# Patient Record
Sex: Male | Born: 1990 | Race: White | Hispanic: No | Marital: Single | Smoking: Current every day smoker
Health system: Southern US, Community
[De-identification: ages and names within clinical notes are randomized; demographics above are authoritative.]

## PROBLEM LIST (undated history)

## (undated) HISTORY — PX: TONSILLECTOMY: SUR1361

---

## 2015-06-23 ENCOUNTER — Emergency Department (HOSPITAL_BASED_OUTPATIENT_CLINIC_OR_DEPARTMENT_OTHER): Payer: BLUE CROSS/BLUE SHIELD

## 2015-06-23 ENCOUNTER — Encounter (HOSPITAL_BASED_OUTPATIENT_CLINIC_OR_DEPARTMENT_OTHER): Payer: Self-pay | Admitting: *Deleted

## 2015-06-23 ENCOUNTER — Emergency Department (HOSPITAL_BASED_OUTPATIENT_CLINIC_OR_DEPARTMENT_OTHER)
Admission: EM | Admit: 2015-06-23 | Discharge: 2015-06-23 | Disposition: A | Discharge status: Home / Self Care | Payer: BLUE CROSS/BLUE SHIELD | Attending: Emergency Medicine | Admitting: Emergency Medicine

## 2015-06-23 DIAGNOSIS — R51 Headache: Secondary | ICD-10-CM | POA: Insufficient documentation

## 2015-06-23 DIAGNOSIS — R519 Headache, unspecified: Secondary | ICD-10-CM

## 2015-06-23 DIAGNOSIS — F17210 Nicotine dependence, cigarettes, uncomplicated: Secondary | ICD-10-CM | POA: Diagnosis not present

## 2015-06-23 NOTE — Discharge Instructions (Signed)
Ibuprofen 600 g every 6 hours as needed for pain.  Watch your caffeine intake.  Return to the emergency department if symptoms significantly worsen or change.   General Headache Without Cause A headache is pain or discomfort felt around the head or neck area. The specific cause of a headache may not be found. There are many causes and types of headaches. A few common ones are:  Tension headaches.  Migraine headaches.  Cluster headaches.  Chronic daily headaches. HOME CARE INSTRUCTIONS  Watch your condition for any changes. Take these steps to help with your condition: Managing Pain  Take over-the-counter and prescription medicines only as told by your health care provider.  Lie down in a dark, quiet room when you have a headache.  If directed, apply ice to the head and neck area:  Put ice in a plastic bag.  Place a towel between your skin and the bag.  Leave the ice on for 20 minutes, 2-3 times per day.  Use a heating pad or hot shower to apply heat to the head and neck area as told by your health care provider.  Keep lights dim if bright lights bother you or make your headaches worse. Eating and Drinking  Eat meals on a regular schedule.  Limit alcohol use.  Decrease the amount of caffeine you drink, or stop drinking caffeine. General Instructions  Keep all follow-up visits as told by your health care provider. This is important.  Keep a headache journal to help find out what may trigger your headaches. For example, write down:  What you eat and drink.  How much sleep you get.  Any change to your diet or medicines.  Try massage or other relaxation techniques.  Limit stress.  Sit up straight, and do not tense your muscles.  Do not use tobacco products, including cigarettes, chewing tobacco, or e-cigarettes. If you need help quitting, ask your health care provider.  Exercise regularly as told by your health care provider.  Sleep on a regular schedule.  Get 7-9 hours of sleep, or the amount recommended by your health care provider. SEEK MEDICAL CARE IF:   Your symptoms are not helped by medicine.  You have a headache that is different from the usual headache.  You have nausea or you vomit.  You have a fever. SEEK IMMEDIATE MEDICAL CARE IF:   Your headache becomes severe.  You have repeated vomiting.  You have a stiff neck.  You have a loss of vision.  You have problems with speech.  You have pain in the eye or ear.  You have muscular weakness or loss of muscle control.  You lose your balance or have trouble walking.  You feel faint or pass out.  You have confusion.   This information is not intended to replace advice given to you by your health care provider. Make sure you discuss any questions you have with your health care provider.   Document Released: 05/09/2005 Document Revised: 01/28/2015 Document Reviewed: 09/01/2014 Elsevier Interactive Patient Education [employer]

## 2015-06-23 NOTE — ED Notes (Signed)
Patient transported to CT 

## 2015-06-23 NOTE — ED Notes (Signed)
Headache since 4 am.

## 2015-06-23 NOTE — ED Provider Notes (Signed)
CSN: [id number]     Arrival date & time 06/23/15  1727 History  By signing my name below, I, Doreatha Martin, attest that this documentation has been prepared under the direction and in the presence of Geoffery Lyons, MD. Electronically Signed: Doreatha Martin, ED Scribe. 06/23/2015. 6:52 PM.    Chief Complaint  Patient presents with  . Headache   Patient is a 25 y.o. male presenting with headaches. The history is provided by the patient. No language interpreter was used.  Headache Pain location:  Generalized Quality:  Dull Radiates to:  Does not radiate Onset quality:  Gradual Duration:  1 day Timing:  Constant Progression:  Partially resolved Chronicity:  New Similar to prior headaches: yes   Context: caffeine   Relieved by:  Acetaminophen Worsened by:  Nothing Ineffective treatments:  None tried Associated symptoms: no blurred vision, no dizziness, no nausea, no neck pain, no numbness, no paresthesias, no tingling, no vomiting and no weakness     HPI Comments: Tristan Sanchez is a 25 y.o. male otherwise healthy who presents to the Emergency Department complaining of moderate, improving central HA onset this morning. He states that he took ibuprofen with moderate relief after 45 minutes, but notes that he still has some mild throbbing pain and soreness. Pt states h/o similar HA, but not as severe. He states he drinks 1 redbull per day and is an occasional drinker. Pt notes he has been drinking more for the past few days than normal. He denies emesis, nausea, visual disturbance, numbness, paresthesia, weakness, neck pain.   History reviewed. No pertinent past medical history. Past Surgical History  Procedure Laterality Date  . Tonsillectomy     No family history on file. Social History  Substance Use Topics  . Smoking status: Current Every Day Smoker -- 0.50 packs/day    Types: Cigarettes  . Smokeless tobacco: None  . Alcohol Use: No    Review of Systems  Eyes: Negative for  blurred vision.  Gastrointestinal: Negative for nausea and vomiting.  Musculoskeletal: Negative for neck pain.  Neurological: Positive for headaches. Negative for dizziness, weakness, numbness and paresthesias.  All other systems reviewed and are negative.  Allergies  Review of patient's allergies indicates no known allergies.  Home Medications   Prior to Admission medications   Not on File   BP 147/82 mmHg  Pulse 60  Temp(Src) 98.3 F (36.8 C) (Oral)  Resp 18  Ht  (1.956 m)  Wt 210 lb (95.255 kg)  BMI 24.90 kg/m2  SpO2 100% Physical Exam  Constitutional: He is oriented to person, place, and time. He appears well-developed and well-nourished.  HENT:  Head: Normocephalic and atraumatic.  Eyes: EOM are normal. Pupils are equal, round, and reactive to light.  No papilledema on fundoscopic exam.   Neck: Normal range of motion.  Cardiovascular: Normal rate, regular rhythm, normal heart sounds and intact distal pulses.   Pulmonary/Chest: Effort normal and breath sounds normal. No respiratory distress.  Abdominal: Soft. He exhibits no distension. There is no tenderness.  Musculoskeletal: Normal range of motion.  Neurological: He is alert and oriented to person, place, and time. He has normal reflexes. No cranial nerve deficit. He exhibits normal muscle tone. Coordination normal.  Skin: Skin is warm and dry.  Psychiatric: He has a normal mood and affect. Judgment normal.  Nursing note and vitals reviewed.   ED Course  Procedures (including critical care time) DIAGNOSTIC STUDIES: Oxygen Saturation is 100% on RA, normal by my interpretation.  COORDINATION OF CARE: 6:50 PM Discussed treatment plan with pt at bedside and pt agreed to plan.    Imaging Review No results found. I have personally reviewed and evaluated these images as part of my medical decision-making.   MDM   Final diagnoses:  None    Patient presents here with complaints of severe headache that  started shortly after he woke up this morning. It has improved throughout the day, however he was concerned with the severity that he experienced this morning. His neurologic exam is nonfocal and head CT is negative. He is feeling better. I see no indication for LP. He will be discharged with instructions take ibuprofen and follow-up as needed for any problems.  I personally performed the services described in this documentation, which was scribed in my presence. The recorded information has been reviewed and is accurate.       Geoffery Lyons, MD 06/23/15 [phone]

## 2015-06-23 NOTE — ED Notes (Signed)
Pt reports he awoke this am 0300 hours with severe headache, throughout day it continued 10/10, mostly to left side of head, currently pain has improved after taking goody powder this afternoon. Pt ambulated to room in nad, talking and communicating with adult at bedside accompanying him

## 2015-07-23 ENCOUNTER — Emergency Department (HOSPITAL_BASED_OUTPATIENT_CLINIC_OR_DEPARTMENT_OTHER)
Admission: EM | Admit: 2015-07-23 | Discharge: 2015-07-23 | Disposition: A | Discharge status: Home / Self Care | Payer: BLUE CROSS/BLUE SHIELD | Attending: Emergency Medicine | Admitting: Emergency Medicine

## 2015-07-23 ENCOUNTER — Emergency Department (HOSPITAL_BASED_OUTPATIENT_CLINIC_OR_DEPARTMENT_OTHER): Payer: BLUE CROSS/BLUE SHIELD

## 2015-07-23 ENCOUNTER — Encounter (HOSPITAL_BASED_OUTPATIENT_CLINIC_OR_DEPARTMENT_OTHER): Payer: Self-pay | Admitting: Emergency Medicine

## 2015-07-23 DIAGNOSIS — S6992XA Unspecified injury of left wrist, hand and finger(s), initial encounter: Secondary | ICD-10-CM | POA: Insufficient documentation

## 2015-07-23 DIAGNOSIS — S060X1A Concussion with loss of consciousness of 30 minutes or less, initial encounter: Secondary | ICD-10-CM | POA: Diagnosis not present

## 2015-07-23 DIAGNOSIS — Y990 Civilian activity done for income or pay: Secondary | ICD-10-CM | POA: Diagnosis not present

## 2015-07-23 DIAGNOSIS — M25532 Pain in left wrist: Secondary | ICD-10-CM

## 2015-07-23 DIAGNOSIS — S0990XA Unspecified injury of head, initial encounter: Secondary | ICD-10-CM | POA: Diagnosis present

## 2015-07-23 DIAGNOSIS — S0191XA Laceration without foreign body of unspecified part of head, initial encounter: Secondary | ICD-10-CM

## 2015-07-23 DIAGNOSIS — Y9289 Other specified places as the place of occurrence of the external cause: Secondary | ICD-10-CM | POA: Diagnosis not present

## 2015-07-23 DIAGNOSIS — S0181XA Laceration without foreign body of other part of head, initial encounter: Secondary | ICD-10-CM | POA: Diagnosis not present

## 2015-07-23 DIAGNOSIS — W208XXA Other cause of strike by thrown, projected or falling object, initial encounter: Secondary | ICD-10-CM | POA: Insufficient documentation

## 2015-07-23 DIAGNOSIS — Y9389 Activity, other specified: Secondary | ICD-10-CM | POA: Diagnosis not present

## 2015-07-23 DIAGNOSIS — F17210 Nicotine dependence, cigarettes, uncomplicated: Secondary | ICD-10-CM | POA: Diagnosis not present

## 2015-07-23 NOTE — ED Provider Notes (Signed)
CSN: [id number]     Arrival date & time 07/23/15  1412 History   First MD Initiated Contact with Patient 07/23/15 1559     Chief Complaint  Patient presents with  . Head Injury     (Consider location/radiation/quality/duration/timing/severity/associated sxs/prior Treatment) HPI Comments: Patient presents to the ED with a chief complaint of head injury.  He states that a hood fell on his head last night.  He states that he had LOC briefly afterward.  States that he also had some nausea and vomiting.  States that he is concerned about being "comatose."  He states that he has had some persistent headache, and nausea.  Denies any additional vomiting, blurred vision, or numbness/weakness/tingling/ataxia.  He also complains of left wrist injury.  States that the hood struck his left wrist and he has had significant pain and swelling.  The history is provided by the patient. No language interpreter was used.    No past medical history on file. Past Surgical History  Procedure Laterality Date  . Tonsillectomy     No family history on file. Social History  Substance Use Topics  . Smoking status: Current Every Day Smoker -- 0.50 packs/day    Types: Cigarettes  . Smokeless tobacco: None  . Alcohol Use: No    Review of Systems  All other systems reviewed and are negative.     Allergies  Review of patient's allergies indicates no known allergies.  Home Medications   Prior to Admission medications   Not on File   BP 135/76 mmHg  Pulse 54  Temp(Src) 97.8 F (36.6 C) (Oral)  Resp 18  Ht  (1.956 m)  Wt 95.255 kg  BMI 24.90 kg/m2  SpO2 100% Physical Exam  Constitutional: He is oriented to person, place, and time. He appears well-developed and well-nourished.  HENT:  Head: Normocephalic and atraumatic.  2 cm laceration to the left parietal scalp which was repaired by the patient using superglue, no evidence of abscess or infection  Eyes: Conjunctivae and EOM are normal.  Pupils are equal, round, and reactive to light. Right eye exhibits no discharge. Left eye exhibits no discharge. No scleral icterus.  Neck: Normal range of motion. Neck supple. No JVD present.  Cardiovascular: Normal rate, regular rhythm and normal heart sounds.  Exam reveals no gallop and no friction rub.   No murmur heard. Pulmonary/Chest: Effort normal and breath sounds normal. No respiratory distress. He has no wheezes. He has no rales. He exhibits no tenderness.  Abdominal: Soft. He exhibits no distension and no mass. There is no tenderness. There is no rebound and no guarding.  Musculoskeletal: Normal range of motion. He exhibits no edema or tenderness.  No CTLS spine tenderness, step-off, or deformity Moves all extremities Left wrist ttp, mild swelling along the ulnar aspect No bony abnormality or deformity  Neurological: He is alert and oriented to person, place, and time.  Skin: Skin is warm and dry.  Psychiatric: He has a normal mood and affect. His behavior is normal. Judgment and thought content normal.  Nursing note and vitals reviewed.   ED Course  Procedures (including critical care time)  No results found for this or any previous visit. Dg Wrist Complete Left  07/23/2015  CLINICAL DATA:  Pain after lifting car hood EXAM: LEFT WRIST - COMPLETE 3+ VIEW COMPARISON:  None. FINDINGS: Frontal, oblique, lateral, and ulnar deviation scaphoid images were obtained. There is no apparent fracture or dislocation. The joint spaces appear normal. The pronator  quadratus fat pad does not show elevation. IMPRESSION: No demonstrable fracture or dislocation.  No apparent arthropathy. Electronically Signed   By: Bretta Bang III M.D.   On: 07/23/2015 16:34   Ct Head Wo Contrast  07/23/2015  CLINICAL DATA:  Car hood fell on head yesterday. Positive loss of consciousness. Vomiting. EXAM: CT HEAD WITHOUT CONTRAST TECHNIQUE: Contiguous axial images were obtained from the base of the skull through  the vertex without intravenous contrast. COMPARISON:  CT scan of June 23, 2015. FINDINGS: Bony calvarium appears intact. No mass effect or midline shift is noted. Ventricular size is within normal limits. There is no evidence of mass lesion, hemorrhage or acute infarction. IMPRESSION: Normal head CT. Electronically Signed   By: Lupita Raider, M.D.   On: 07/23/2015 16:32   Ct Head Wo Contrast  06/23/2015  CLINICAL DATA:  Headaches since this morning. EXAM: CT HEAD WITHOUT CONTRAST TECHNIQUE: Contiguous axial images were obtained from the base of the skull through the vertex without contrast. COMPARISON:  None FINDINGS: Normal appearance of the intracranial structures. No evidence for acute hemorrhage, mass lesion, midline shift, hydrocephalus or large infarct. No acute bony abnormality. The visualized sinuses are clear. IMPRESSION: Negative head CT. Electronically Signed   By: Richarda Overlie M.D.   On: 06/23/2015 19:34     I have personally reviewed and evaluated these images and lab results as part of my medical decision-making.    MDM   Final diagnoses:  Head injury, initial encounter  Concussion, with loss of consciousness of 30 minutes or less, initial encounter  Laceration of head, initial encounter  Left wrist pain   Patient with head injury.  He is quite concerned about this because of LOC and vomiting after the fact.  He has been fine today though.  Given patient reassurance, but will check head CT and will also image left wrist.  Tdap is up to date.  Patient closed his own head laceration yesterday with superglue.  Wound care precautions given.    Roxy Horseman, PA-C 07/23/15 1730  Vanetta Mulders, MD 07/24/15 [phone]

## 2015-07-23 NOTE — ED Notes (Signed)
Pt working on a car yesterday, hood of car fell onto pts head.  Pt had brief loss of consciousness 5 minutes later.   Pt had diaphoresis, vomited x 1 at that time.  Pt now is having some pressure behind his left eye.  Some left sided tooth pain, and some lower back pain.  Pt also has injury to left wrist when pt tried to catch the hood of car.  Bruising noted.  No new emesis.  No blurred vision.  No ambulation problems.

## 2015-07-23 NOTE — ED Notes (Signed)
Left wrist has slight discoloration and swelling.

## 2015-07-23 NOTE — Discharge Instructions (Signed)
Concussion, Adult °A concussion, or closed-head injury, is a brain injury caused by a direct blow to the head or by a quick and sudden movement (jolt) of the head or neck. Concussions are usually not life-threatening. Even so, the effects of a concussion can be serious. If you have had a concussion before, you are more likely to experience concussion-like symptoms after a direct blow to the head.  °CAUSES °· Direct blow to the head, such as from running into another player during a soccer game, being hit in a fight, or hitting your head on a hard surface. °· A jolt of the head or neck that causes the brain to move back and forth inside the skull, such as in a car crash. °SIGNS AND SYMPTOMS °The signs of a concussion can be hard to notice. Early on, they may be missed by you, family members, and health care providers. You may look fine but act or feel differently. °Symptoms are usually temporary, but they may last for days, weeks, or even longer. Some symptoms may appear right away while others may not show up for hours or days. Every head injury is different. Symptoms include: °· Mild to moderate headaches that will not go away. °· A feeling of pressure inside your head. °· Having more trouble than usual: °¨ Learning or remembering things you have heard. °¨ Answering questions. °¨ Paying attention or concentrating. °¨ Organizing daily tasks. °¨ Making decisions and solving problems. °· Slowness in thinking, acting or reacting, speaking, or reading. °· Getting lost or being easily confused. °· Feeling tired all the time or lacking energy (fatigued). °· Feeling drowsy. °· Sleep disturbances. °¨ Sleeping more than usual. °¨ Sleeping less than usual. °¨ Trouble falling asleep. °¨ Trouble sleeping (insomnia). °· Loss of balance or feeling lightheaded or dizzy. °· Nausea or vomiting. °· Numbness or tingling. °· Increased sensitivity to: °¨ Sounds. °¨ Lights. °¨ Distractions. °· Vision problems or eyes that tire  easily. °· Diminished sense of taste or smell. °· Ringing in the ears. °· Mood changes such as feeling sad or anxious. °· Becoming easily irritated or angry for little or no reason. °· Lack of motivation. °· Seeing or hearing things other people do not see or hear (hallucinations). °DIAGNOSIS °Your health care provider can usually diagnose a concussion based on a description of your injury and symptoms. He or she will ask whether you passed out (lost consciousness) and whether you are having trouble remembering events that happened right before and during your injury. °Your evaluation might include: °· A brain scan to look for signs of injury to the brain. Even if the test shows no injury, you may still have a concussion. °· Blood tests to be sure other problems are not present. °TREATMENT °· Concussions are usually treated in an emergency department, in urgent care, or at a clinic. You may need to stay in the hospital overnight for further treatment. °· Tell your health care provider if you are taking any medicines, including prescription medicines, over-the-counter medicines, and natural remedies. Some medicines, such as blood thinners (anticoagulants) and aspirin, may increase the chance of complications. Also tell your health care provider whether you have had alcohol or are taking illegal drugs. This information may affect treatment. °· Your health care provider will send you home with important instructions to follow. °· How fast you will recover from a concussion depends on many factors. These factors include how severe your concussion is, what part of your brain was injured,   your age, and how healthy you were before the concussion. °· Most people with mild injuries recover fully. Recovery can take time. In general, recovery is slower in older persons. Also, persons who have had a concussion in the past or have other medical problems may find that it takes longer to recover from their current injury. °HOME  CARE INSTRUCTIONS °General Instructions °· Carefully follow the directions your health care provider gave you. °· Only take over-the-counter or prescription medicines for pain, discomfort, or fever as directed by your health care provider. °· Take only those medicines that your health care provider has approved. °· Do not drink alcohol until your health care provider says you are well enough to do so. Alcohol and certain other drugs may slow your recovery and can put you at risk of further injury. °· If it is harder than usual to remember things, write them down. °· If you are easily distracted, try to do one thing at a time. For example, do not try to watch TV while fixing dinner. °· Talk with family members or close friends when making important decisions. °· Keep all follow-up appointments. Repeated evaluation of your symptoms is recommended for your recovery. °· Watch your symptoms and tell others to do the same. Complications sometimes occur after a concussion. Older adults with a brain injury may have a higher risk of serious complications, such as a blood clot on the brain. °· Tell your teachers, school nurse, school counselor, coach, athletic trainer, or work manager about your injury, symptoms, and restrictions. Tell them about what you can or cannot do. They should watch for: °¨ Increased problems with attention or concentration. °¨ Increased difficulty remembering or learning new information. °¨ Increased time needed to complete tasks or assignments. °¨ Increased irritability or decreased ability to cope with stress. °¨ Increased symptoms. °· Rest. Rest helps the brain to heal. Make sure you: °¨ Get plenty of sleep at night. Avoid staying up late at night. °¨ Keep the same bedtime hours on weekends and weekdays. °¨ Rest during the day. Take daytime naps or rest breaks when you feel tired. °· Limit activities that require a lot of thought or concentration. These include: °¨ Doing homework or job-related  work. °¨ Watching TV. °¨ Working on the computer. °· Avoid any situation where there is potential for another head injury (football, hockey, soccer, basketball, martial arts, downhill snow sports and horseback riding). Your condition will get worse every time you experience a concussion. You should avoid these activities until you are evaluated by the appropriate follow-up health care providers. °Returning To Your Regular Activities °You will need to return to your normal activities slowly, not all at once. You must give your body and brain enough time for recovery. °· Do not return to sports or other athletic activities until your health care provider tells you it is safe to do so. °· Ask your health care provider when you can drive, ride a bicycle, or operate heavy machinery. Your ability to react may be slower after a brain injury. Never do these activities if you are dizzy. °· Ask your health care provider about when you can return to work or school. °Preventing Another Concussion °It is very important to avoid another brain injury, especially before you have recovered. In rare cases, another injury can lead to permanent brain damage, brain swelling, or death. The risk of this is greatest during the first 7-10 days after a head injury. Avoid injuries by: °· Wearing a   seat belt when riding in a car.  Drinking alcohol only in moderation.  Wearing a helmet when biking, skiing, skateboarding, skating, or doing similar activities.  Avoiding activities that could lead to a second concussion, such as contact or recreational sports, until your health care provider says it is okay.  Taking safety measures in your home.  Remove clutter and tripping hazards from floors and stairways.  Use grab bars in bathrooms and handrails by stairs.  Place non-slip mats on floors and in bathtubs.  Improve lighting in dim areas. SEEK MEDICAL CARE IF:  You have increased problems paying attention or  concentrating.  You have increased difficulty remembering or learning new information.  You need more time to complete tasks or assignments than before.  You have increased irritability or decreased ability to cope with stress.  You have more symptoms than before. Seek medical care if you have any of the following symptoms for more than 2 weeks after your injury:  Lasting (chronic) headaches.  Dizziness or balance problems.  Nausea.  Vision problems.  Increased sensitivity to noise or light.  Depression or mood swings.  Anxiety or irritability.  Memory problems.  Difficulty concentrating or paying attention.  Sleep problems.  Feeling tired all the time. SEEK IMMEDIATE MEDICAL CARE IF:  You have severe or worsening headaches. These may be a sign of a blood clot in the brain.  You have weakness (even if only in one hand, leg, or part of the face).  You have numbness.  You have decreased coordination.  You vomit repeatedly.  You have increased sleepiness.  One pupil is larger than the other.  You have convulsions.  You have slurred speech.  You have increased confusion. This may be a sign of a blood clot in the brain.  You have increased restlessness, agitation, or irritability.  You are unable to recognize people or places.  You have neck pain.  It is difficult to wake you up.  You have unusual behavior changes.  You lose consciousness. MAKE SURE YOU:  Understand these instructions.  Will watch your condition.  Will get help right away if you are not doing well or get worse.   This information is not intended to replace advice given to you by your health care provider. Make sure you discuss any questions you have with your health care provider.   Document Released: 07/30/2003 Document Revised: 05/30/2014 Document Reviewed: 11/29/2012 [employer] Interactive Patient Education 2016 [employer] Joint Pain Joint pain, which is also called  arthralgia, can be caused by many things. Joint pain often goes away when you follow your health care provider's instructions for relieving pain at home. However, joint pain can also be caused by conditions that require further treatment. Common causes of joint pain include:  Bruising in the area of the joint.  Overuse of the joint.  Wear and tear on the joints that occur with aging (osteoarthritis).  Various other forms of arthritis.  A buildup of a crystal form of uric acid in the joint (gout).  Infections of the joint (septic arthritis) or of the bone (osteomyelitis). Your health care provider may recommend medicine to help with the pain. If your joint pain continues, additional tests may be needed to diagnose your condition. HOME CARE INSTRUCTIONS Watch your condition for any changes. Follow these instructions as directed to lessen the pain that you are feeling.  Take medicines only as directed by your health care provider.  Rest the affected area for as long  as your health care provider says that you should. If directed to do so, raise the painful joint above the level of your heart while you are sitting or lying down.  Do not do things that cause or worsen pain.  If directed, apply ice to the painful area:  Put ice in a plastic bag.  Place a towel between your skin and the bag.  Leave the ice on for 20 minutes, 2-3 times per day.  Wear an elastic bandage, splint, or sling as directed by your health care provider. Loosen the elastic bandage or splint if your fingers or toes become numb and tingle, or if they turn cold and blue.  Begin exercising or stretching the affected area as directed by your health care provider. Ask your health care provider what types of exercise are safe for you.  Keep all follow-up visits as directed by your health care provider. This is important. SEEK MEDICAL CARE IF:  Your pain increases, and medicine does not help.  Your joint pain does not  improve within 3 days.  You have increased bruising or swelling.  You have a fever.  You lose 10 lb (4.5 kg) or more without trying. SEEK IMMEDIATE MEDICAL CARE IF:  You are not able to move the joint.  Your fingers or toes become numb or they turn cold and blue.   This information is not intended to replace advice given to you by your health care provider. Make sure you discuss any questions you have with your health care provider.   Document Released: 05/09/2005 Document Revised: 05/30/2014 Document Reviewed: 02/18/2014 [employer] Interactive Patient Education [employer]

## 2015-07-23 NOTE — ED Notes (Signed)
Patient given ice pack to apply to left wrist injury.

## 2015-07-23 NOTE — ED Notes (Signed)
DC instructions reviewed with pt, discussed S/S of concussion and when the need may arise to return to the ED. Also discussed care to injury of left wrist with ice and elevation. Opportunity for questions provided. Home with family member

## 2017-06-02 IMAGING — CT CT HEAD W/O CM
1 series · 16 of 30 positions shown, 20 images · non-contrast
Comparison: None

CLINICAL DATA: Headaches since this morning.

EXAM:
CT HEAD WITHOUT CONTRAST
TECHNIQUE: Contiguous axial images were obtained from the base of the skull
through the vertex without contrast.

[Series 2: head wo · axial · 0.47mm/px · z∈[-140,+5]mm · 16 of 33 slices shown, 20 images]
[im 2/33  brain]
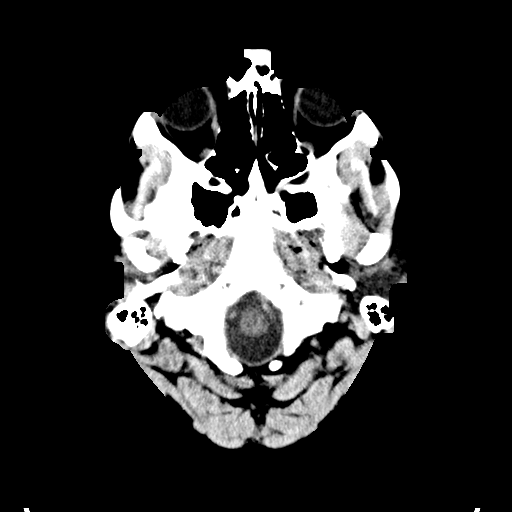
[im 2/33  bone]
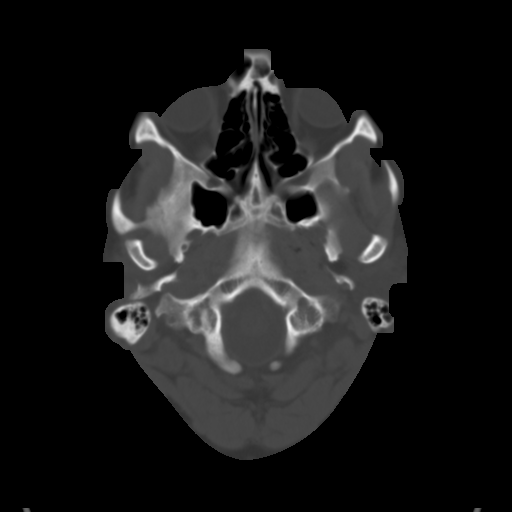
[im 4/33  brain]
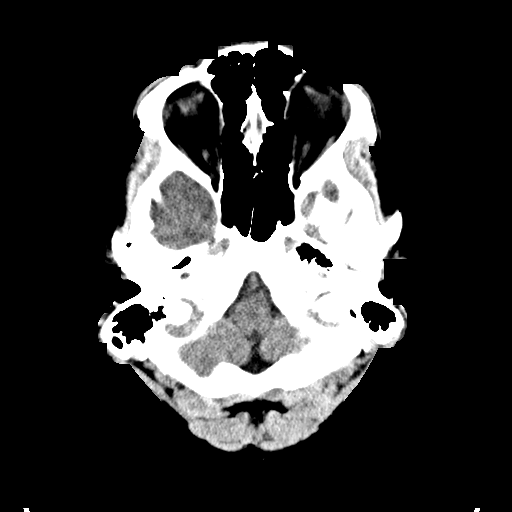
[im 6/33  brain]
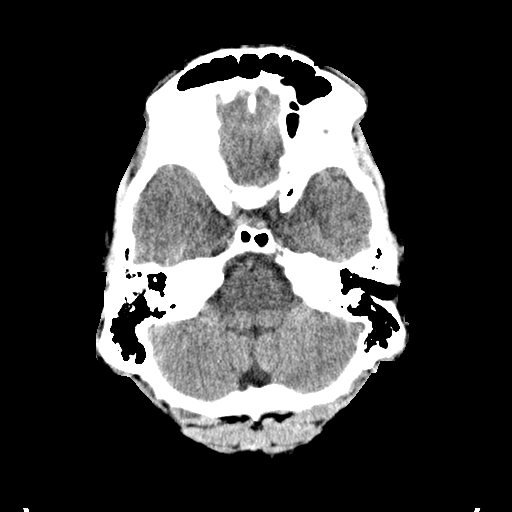
[im 8/33  brain]
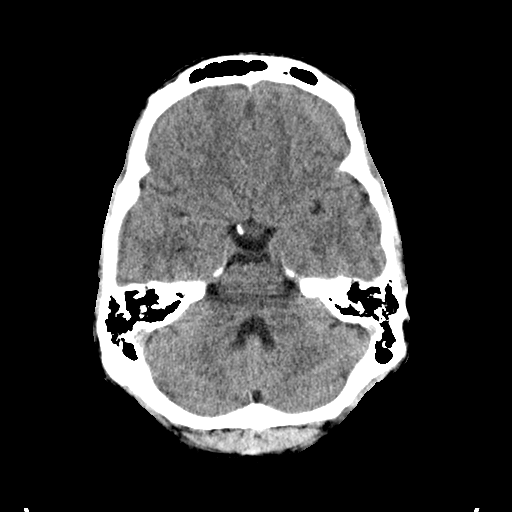
[im 9/33  brain]
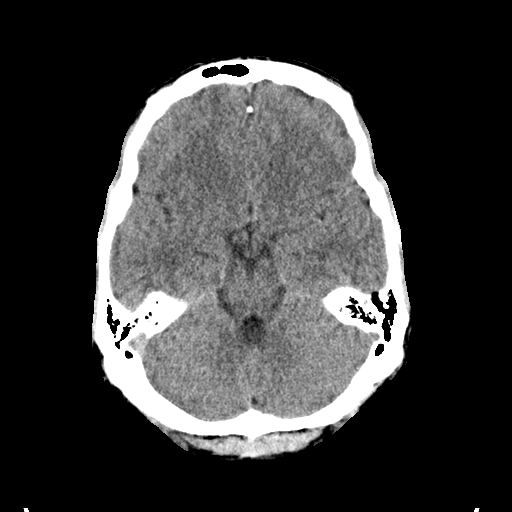
[im 9/33  bone]
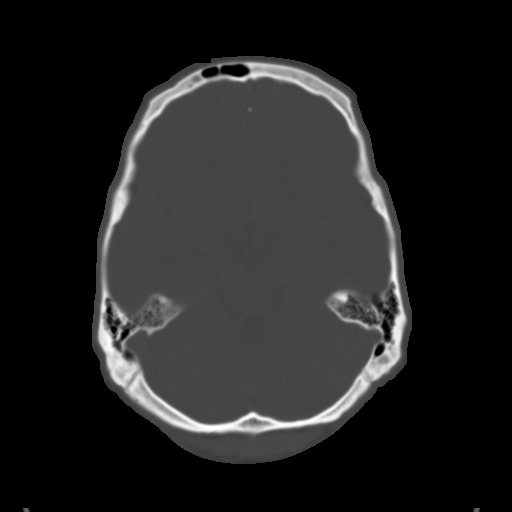
[im 12/33  brain]
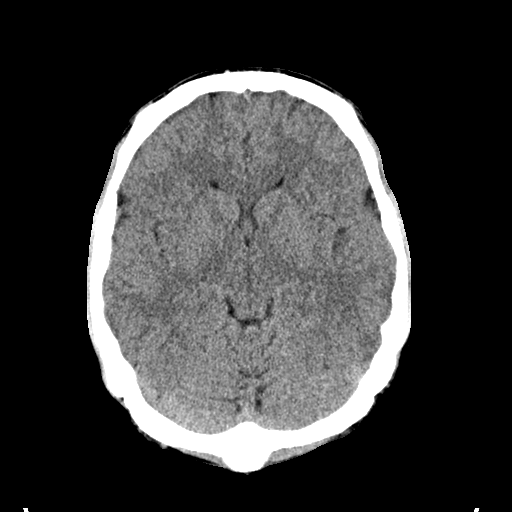
[im 14/33  brain]
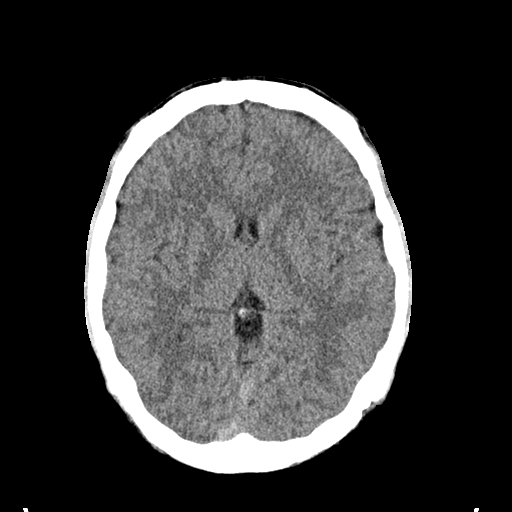
[im 16/33  brain]
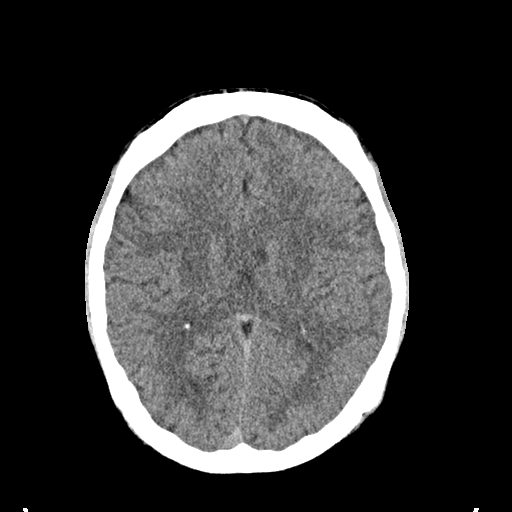
[im 17/33  brain]
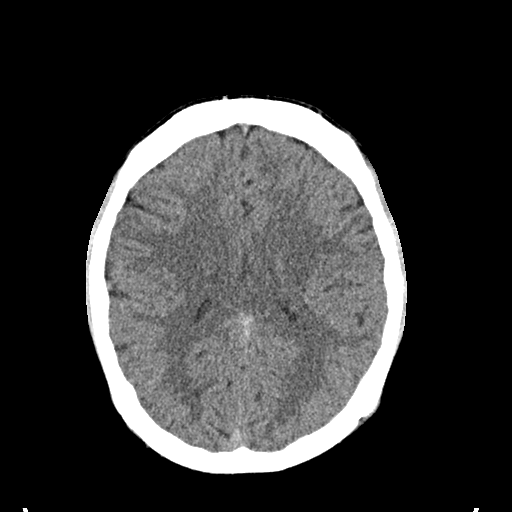
[im 17/33  bone]
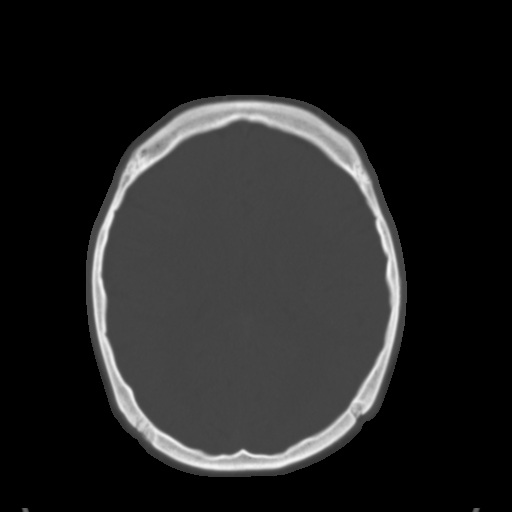
[im 19/33  brain]
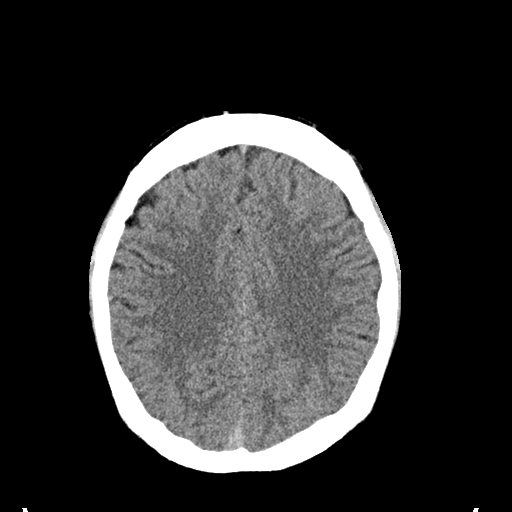
[im 21/33  brain]
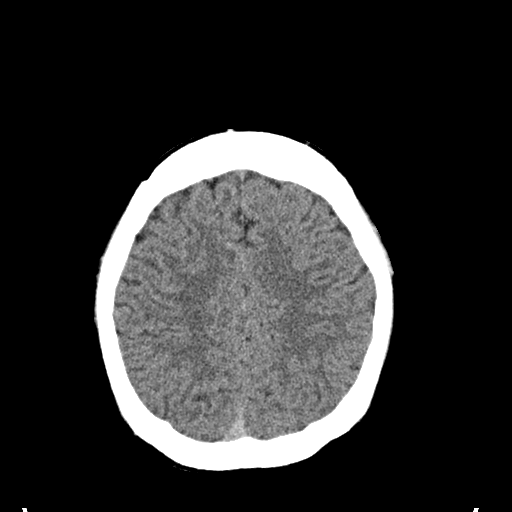
[im 24/33  brain]
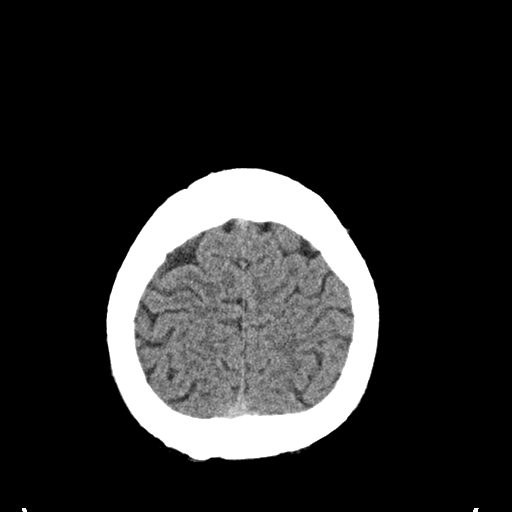
[im 25/33  brain]
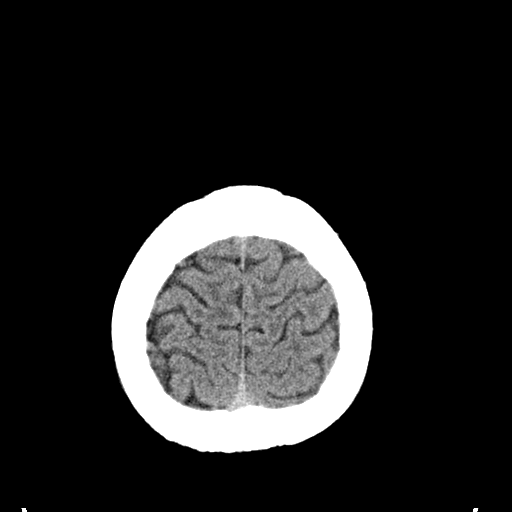
[im 25/33  bone]
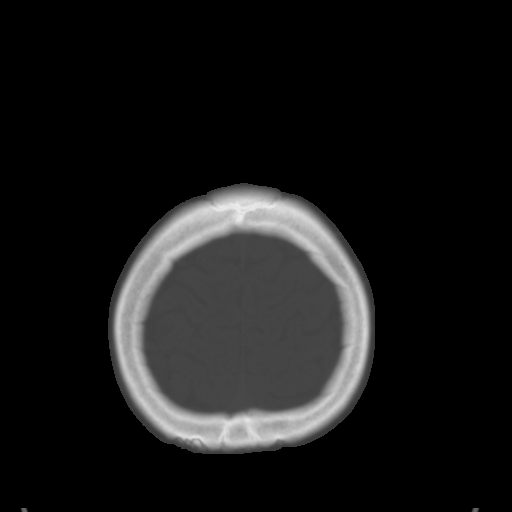
[im 27/33  brain]
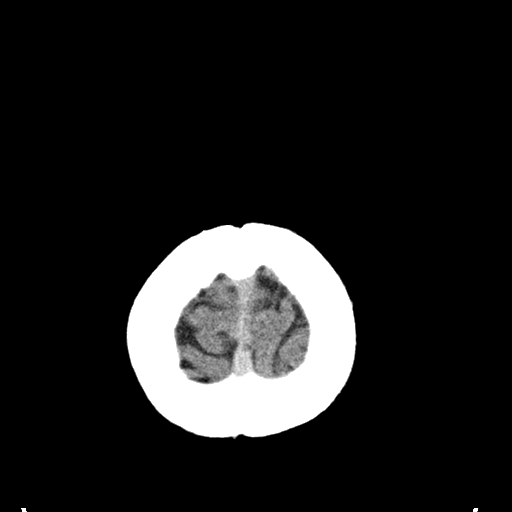
[im 29/33  brain]
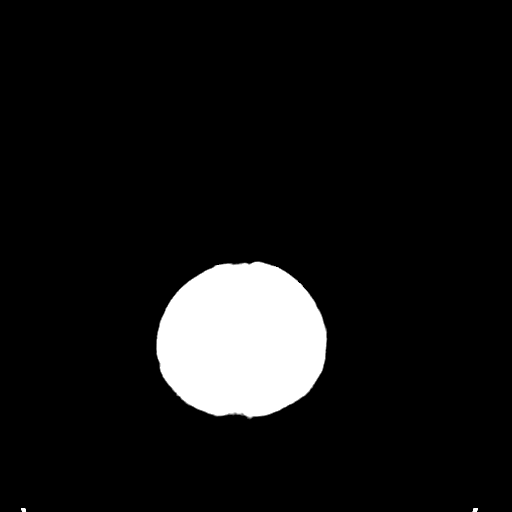
[im 31/33  brain]
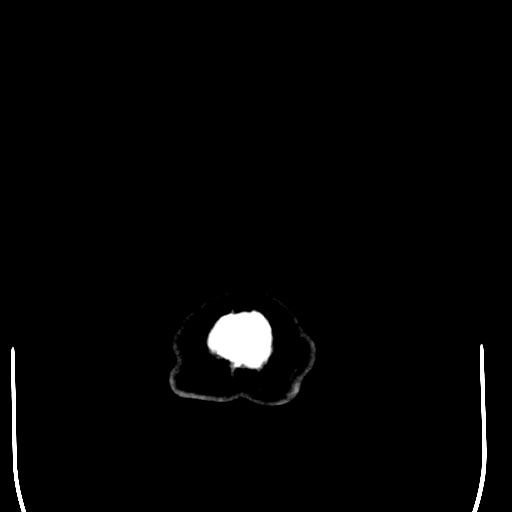

[16 of 30 positions shown; findings below may reference images not displayed]

FINDINGS: Normal appearance of the intracranial structures. No evidence for
acute hemorrhage, mass lesion, midline shift, hydrocephalus or large
infarct. No acute bony abnormality. The visualized sinuses are
clear.
IMPRESSION: Negative head CT.

## 2019-12-06 ENCOUNTER — Ambulatory Visit: Payer: BLUE CROSS/BLUE SHIELD | Admitting: Family Medicine
